# Patient Record
Sex: Female | Born: 1955 | Race: White | Hispanic: No | Marital: Married | Smoking: Former smoker
Health system: Southern US, Community
[De-identification: ages and names within clinical notes are randomized; demographics above are authoritative.]

## PROBLEM LIST (undated history)

## (undated) DIAGNOSIS — I10 Essential (primary) hypertension: Secondary | ICD-10-CM

## (undated) DIAGNOSIS — M069 Rheumatoid arthritis, unspecified: Secondary | ICD-10-CM

## (undated) DIAGNOSIS — K5190 Ulcerative colitis, unspecified, without complications: Secondary | ICD-10-CM

## (undated) HISTORY — PX: APPENDECTOMY: SHX54

## (undated) HISTORY — PX: ABDOMINAL HYSTERECTOMY: SHX81

---

## 2004-07-16 ENCOUNTER — Ambulatory Visit: Payer: Self-pay | Admitting: Family Medicine

## 2005-02-06 ENCOUNTER — Ambulatory Visit: Payer: Self-pay | Admitting: Family Medicine

## 2006-05-29 ENCOUNTER — Ambulatory Visit: Payer: Self-pay | Admitting: Family Medicine

## 2006-10-22 ENCOUNTER — Ambulatory Visit: Payer: Self-pay | Admitting: Family Medicine

## 2006-10-28 ENCOUNTER — Ambulatory Visit: Payer: Self-pay | Admitting: Family Medicine

## 2006-11-03 ENCOUNTER — Ambulatory Visit: Payer: Self-pay | Admitting: Cardiology

## 2006-11-17 ENCOUNTER — Ambulatory Visit: Payer: Self-pay | Admitting: Family Medicine

## 2006-11-19 ENCOUNTER — Ambulatory Visit: Payer: Self-pay | Admitting: *Deleted

## 2006-11-19 ENCOUNTER — Ambulatory Visit: Payer: Self-pay

## 2006-11-19 ENCOUNTER — Encounter: Payer: Self-pay | Admitting: Internal Medicine

## 2006-12-05 ENCOUNTER — Ambulatory Visit: Payer: Self-pay

## 2006-12-05 ENCOUNTER — Ambulatory Visit: Payer: Self-pay | Admitting: Cardiology

## 2007-01-05 ENCOUNTER — Ambulatory Visit: Payer: Self-pay | Admitting: Family Medicine

## 2008-03-06 IMAGING — CT CT CHEST W/O CM
1 of 8 series · 14 of 35 positions shown, 18 images · IV contrast (CONTRAST)
Comparison: NONE

CLINICAL DATA: Right leg DVT, evaluate for pulmonary embolism. 

CT ANGIOGRAPHY CHEST WITHOUT AND WITH INTRAVENOUS CONTRAST
TECHNIQUE: Spiral 3 millimeter thick axial images at 3 mm 
increments were obtained from the lung apex through the upper 
abdomen after the administration of 100 cc of IV contrast.  
Coronal reconstructions were obtained.

[Series 4: arterial 2x2 · axial · arterial · 0.78mm/px · z∈[+1361,+1699]mm · 14 of 197 slices shown, 18 images]
[im 14/197  mediastinal]
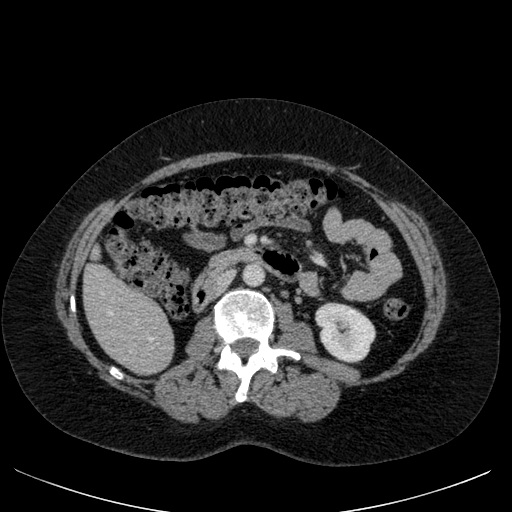
[im 14/197  lung]
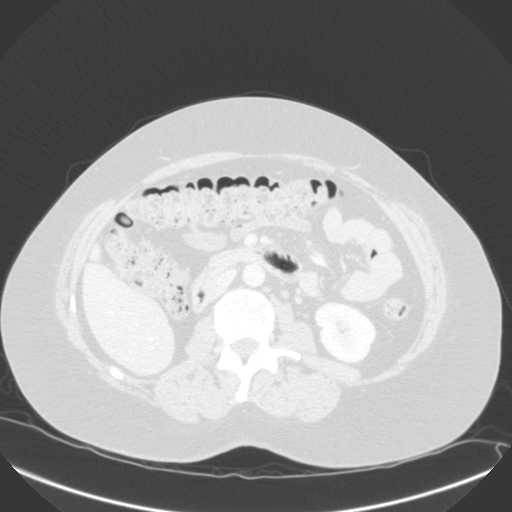
[im 27/197  lung]
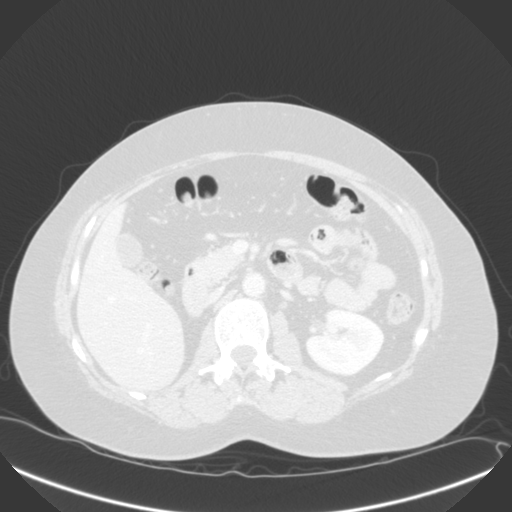
[im 40/197  lung]
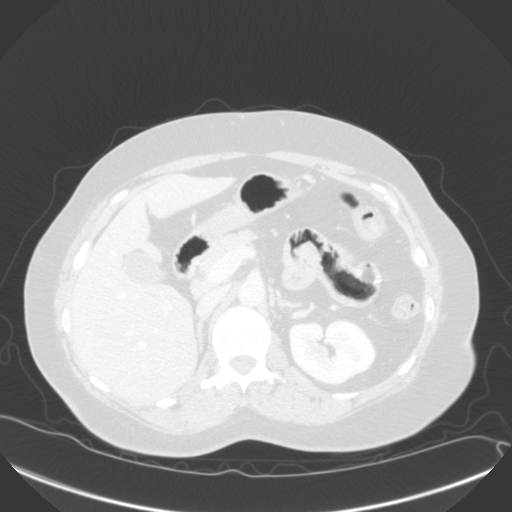
[im 53/197  lung]
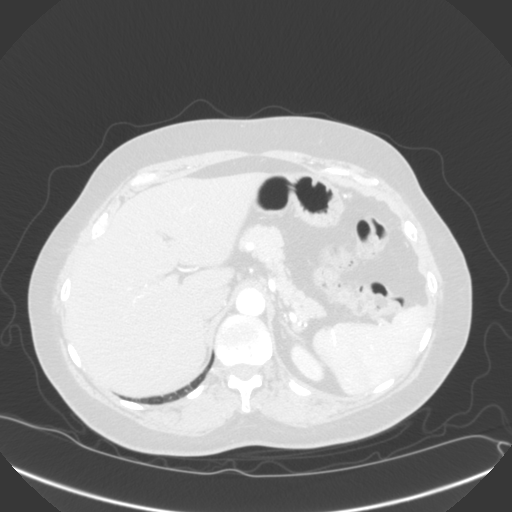
[im 66/197  mediastinal]
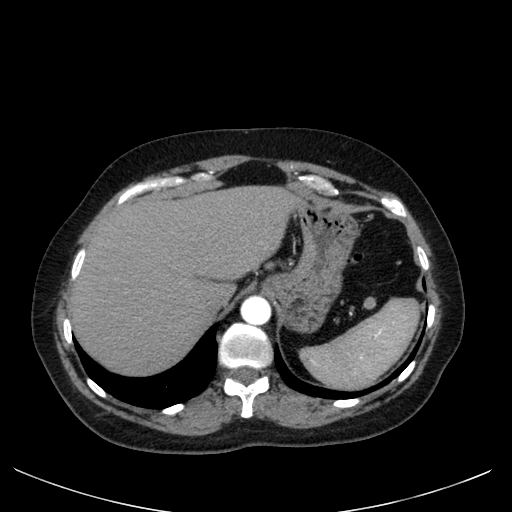
[im 66/197  lung]
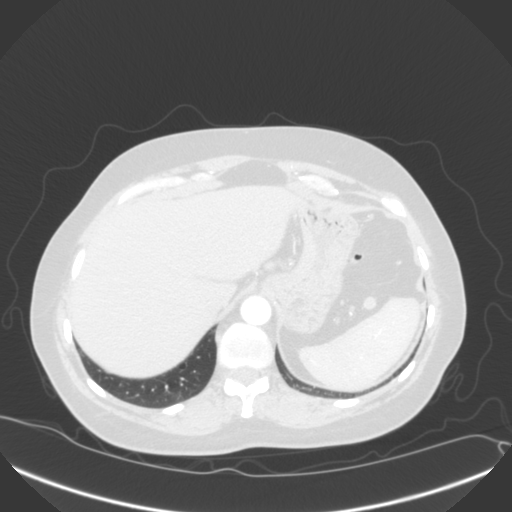
[im 79/197  lung]
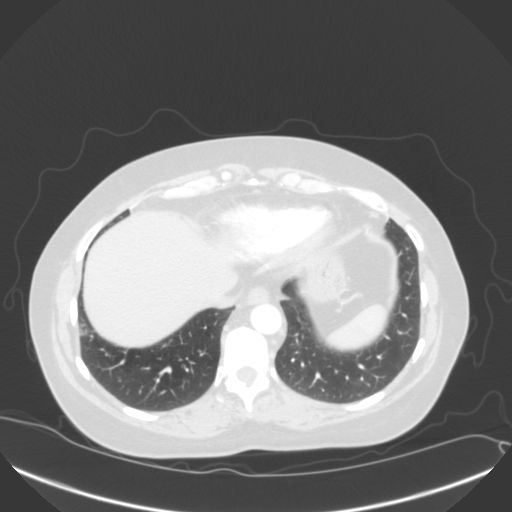
[im 92/197  lung]
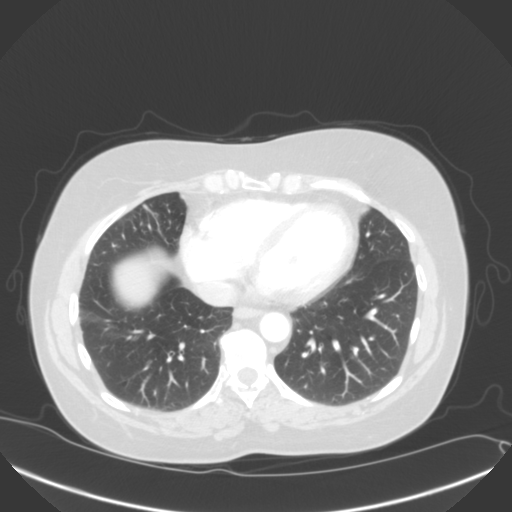
[im 105/197  lung]
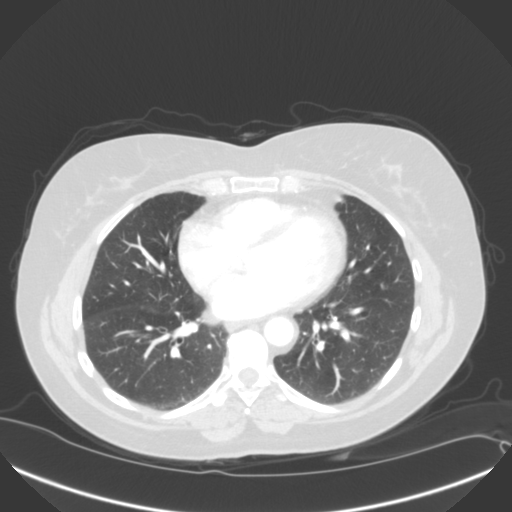
[im 118/197  mediastinal]
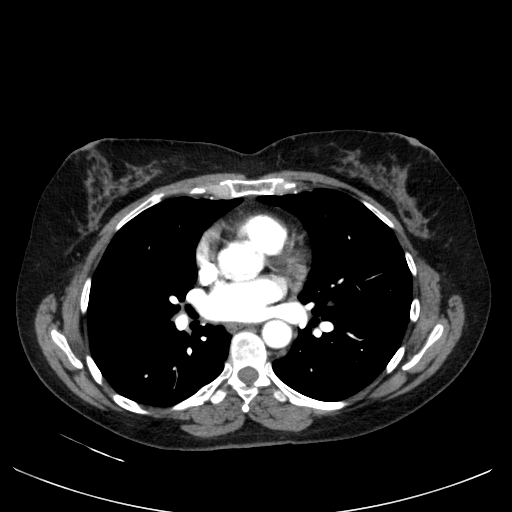
[im 118/197  lung]
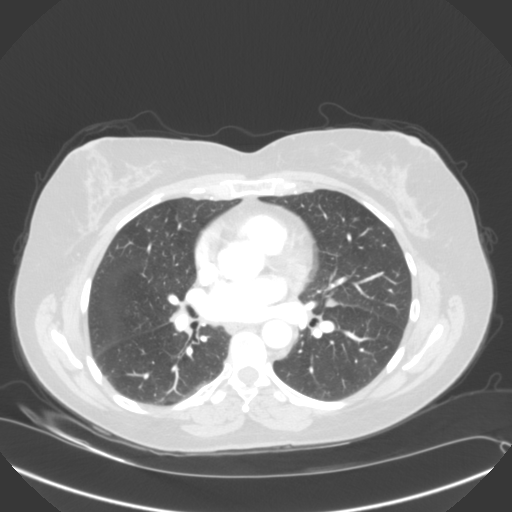
[im 131/197  lung]
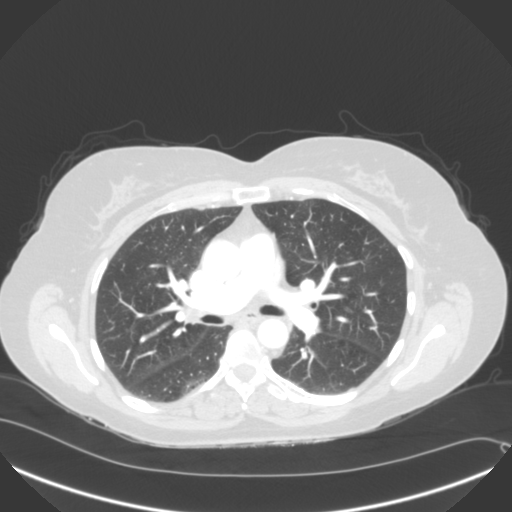
[im 144/197  lung]
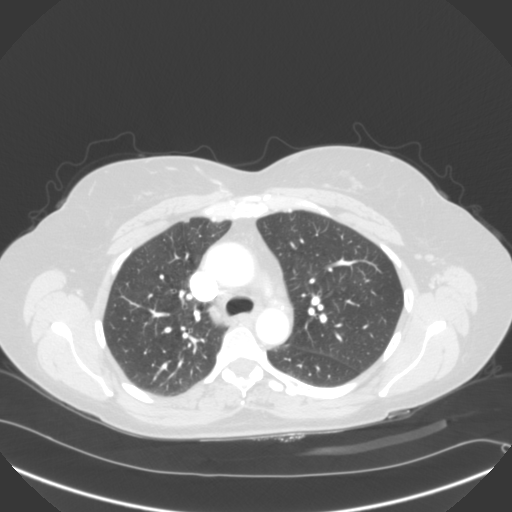
[im 157/197  lung]
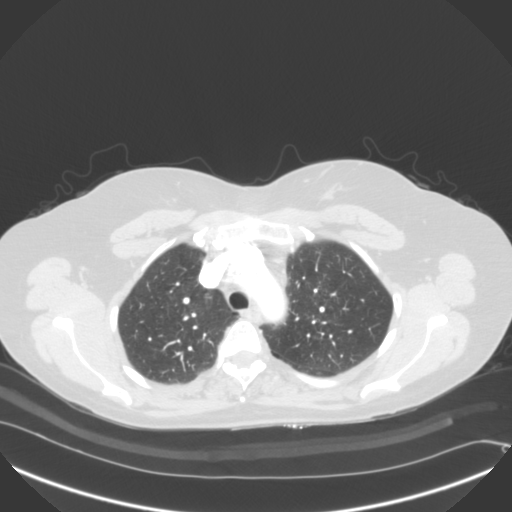
[im 170/197  mediastinal]
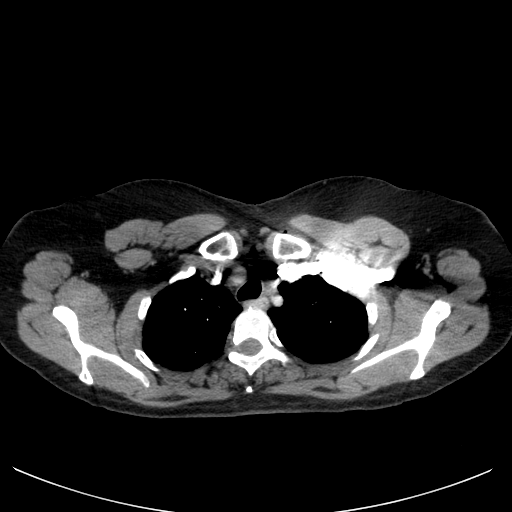
[im 170/197  lung]
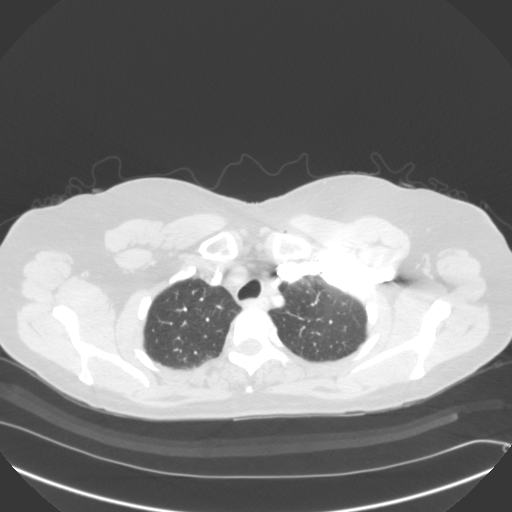
[im 183/197  lung]
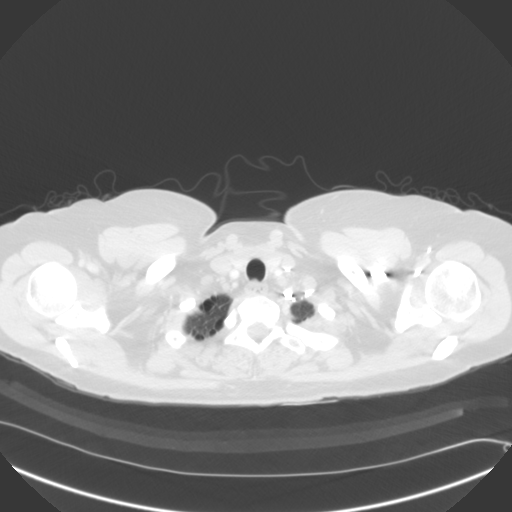

[14 of 35 positions shown; findings below may reference images not displayed]

FINDINGS: There are no prior chest CT studies available for 
comparison. Heart size appears to be within normal limits.  There 
is no evidence of mediastinal lymphadenopathy.  Small preaortic 
and aortopulmonary window nodes are seen.  No evidence of hilar 
lymphadenopathy.  Calcification is seen in the superior segment of 
the left lower lobe.  No pleural effusions or pneumothorax.  Small 
area of accessory splenic tissue is seen.  This measures 
approximately 11 mm in diameter.  There is no evidence of 
pulmonary embolism to the segmental level.
IMPRESSION: No evidence of pulmonary embolism to the segmental 
level. Calcified granuloma suggested in the superior segment of 
11/19/2006 Dict Date: 11/19/2006  Trans Date: 11/19/2006 JH  JLM

## 2010-12-11 NOTE — Procedures (Signed)
[facility]                              EXERCISE TREADMILL   NAME:Courtney Pratt, Courtney Pratt                        MRN:          [id number]  DATE:12/05/2006                            DOB:          11-Jan-1956    PRIMARY CARE PHYSICIAN:  Delaney Meigs, M.D.   PROCEDURE:  Exercise treadmill test.   INDICATION:  Evaluate a patient with chest pain and cardiovascular risk  factors.   PROCEDURE NOTE:  The patient was exercised using standard Bruce  protocol.  She was able to exercise for 8 minutes.  She stopped because  of leg fatigue.  She had no chest discomfort.  She did achieve her  target heart rate of 155, which was 90% of predicted.  She achieved 10.1  mets.  She completed 2 minutes into stage 3.  She had appropriate blood  pressure response with maximum 169/71.  There were no ischemic ST-T wave  changes.  There were no arrhythmias.  She did have a borderline heart  rate recovery.   CONCLUSION:  Negative adequate exercise treadmill test.  There is no  evidence of high grade obstructive coronary disease.  She has moderate  exercise tolerance and a borderline heart rate recovery.   PLAN:  Based on the above, the patient is at intermediate risk for  future cardiovascular events but does not have any evidence for high  grade obstructive coronary disease and needs no further testing.  She  needs continued aggressive risk reduction.  We discussed the need to  stop smoking.  She will continue the medications as listed.  She should  have aggressive lipid management per Dr. Lysbeth Galas.  I would suggest an LDL  less than 100 and HDL in the 50s as a target.   FOLLOWUP:  As needed in this clinic.     Rollene Rotunda, MD, [facility]     JH/MedQ  DD: 12/05/2006  DT: 12/05/2006  Job #: [id number]   cc:   Delaney Meigs, M.D.

## 2010-12-14 NOTE — Assessment & Plan Note (Signed)
[facility]                            CARDIOLOGY OFFICE NOTE   NAME:HODGESDiahn, Courtney Pratt                        MRN:          [id number]  DATE:11/03/2006                            DOB:          08/26/1955    PRIMARY CARE PHYSICIAN:  Dr. Joette Catching.   REASON FOR PRESENTATION:  Evaluate patient with shortness of breath and  chest pain.   HISTORY OF PRESENT ILLNESS:  The patient is a 56 year old with chest  discomfort. This started a couple of weeks ago. She noticed it late 1  evening. She had difficultly lying down without getting chest  discomfort. She slept in a chair that night. She was short of breath.  She presented to urgent care and was subsequently treated with soma and  Vicodin. She said that over several days of taking this regimen her pain  resolved. She still gets some fleeting chest discomfort.   She said her discomfort was under her left breast. It was sharp. It was  worse lying flat or taking a deep breath. There was no fever or chills.  She had been coughing with an upper respiratory infection prior to this.  She had no neck or arm discomfort. She had no associated nausea, or  vomiting, or diaphoresis. She did not have any palpitations above her  baseline. She has reasonable exercise tolerance and this has not  changed. She was having this at rest and could not bring it on with  activity. She has never had any discomfort like this before.   PAST MEDICAL HISTORY:  Palpitations times 12 years (infrequent),  depression.   PAST SURGICAL HISTORY:  Tubal ligation, hysterectomy.   ALLERGIES:  MORPHINE AND AUGMENTIN.   MEDICATIONS:  1. Wellbutrin 150 mg b.i.d.  2. Diltiazem CD 180 mg daily.  3. Premarin.  4. Alprazolam.   SOCIAL HISTORY:  The patient works as a Sport and exercise psychologist. She is  married. She has 1 child. She has been smoking 1 and a half to 1 quater  pack for over 20 years. She occasionally Drinks alcohol.   FAMILY  HISTORY:  Contributory for her mother dying of a CVA at age 25.  There is no early heart disease.   REVIEW OF SYSTEMS:  Positive for wearing glasses, palpitations. Negative  for other systems.   PHYSICAL EXAMINATION:  The patient is in no distress. Blood pressure  149/77, heart rate 86, regular, weight 208, body mass index 31.  HEENT: Eyelids unremarkable, pupils equal, round, and reactive to light.  Fundi not visualized. Oral mucosa, unremarkable.  NECK: No jugular venous distension. Wave form within normal limits.  Carotid upstroke brisk and symmetric. No bruits or thyromegaly.  LYMPHATICS: No cervical, axillary, inguinal adenopathy.  LUNGS: Clear to auscultation bilaterally.  BACK: No costovertebral angle tenderness.  CHEST: Unremarkable.  HEART: PMI not displaced or sustained. S1, and S2, within normal limits.  No S3. No S4, clicks, rubs, murmurs.  ABDOMEN: Flat, positive bowel sounds normal to frequency and pitch, no  bruits. No rebound, no guarding. No midline pulsatile mass,  hepatomegaly, splenomegaly.  SKIN: No rashes.  no nodules  EXTREMITIES: 2 + pulses throughout, no edema, cyanosis, or clubbing.  NEURO: Oriented to person place and time. Cranial nerves II-XII grossly  intact, motor grossly intact.   EKG: Rightward axis, intervals within normal limits, no acute ST-T wave  changes.   ASSESSMENT/PLAN:  1. Chest discomfort, the patient's chest discomfort sounded pleuritic      in nature. It seems to have resolved. I am going to get an      echocardiogram to make sure that there is no effusion or residual      evidence of any pericarditis. Also the patient will have an      exercise treadmill test because of her multiple cardiac risk      factors though I think there is a low pretest probability of      obstructive coronary disease causing her symptoms.  2. Hypertension, blood pressure is slightly elevated. However, this is      the first reading we have with this patient  and this has not been      reported as a problem before. She is advised to keep a watch on      this.  3. Tobacco, we talked about the need to stop smoking. She could take      Chantix with Wellbutrin. She will take this up with Dr. Lysbeth Galas.  4. Follow up will be at the time of her treadmill test and based on      the results of the echocardiogram or any recurrent symptoms.     Rollene Rotunda, MD, [facility]  Electronically Signed    JH/MedQ  DD: 11/03/2006  DT: 11/03/2006  Job #: [id number]   cc:   Delaney Meigs, M.D.

## 2010-12-14 NOTE — Assessment & Plan Note (Signed)
[facility]                            CARDIOLOGY OFFICE NOTE   NAME:Courtney Pratt, Nikolov                        MRN:          [id number]  DATE:11/19/2006                            DOB:          03-27-1956    The patient seen at [facility] on November 19, 2006 for Dr. Glennon Hamilton.  This is the patient of Dr. Antoine Poche and Dr. Joette Catching.  The patient was here for routine treadmill because of recent chest  discomfort; and was complaining of a reddened area and pain in her right  lateral calf that just turned up within the past several days.  She says  that it has been quite painful to touch and to walk; and has, therefore,  not doing her treadmill at home.  When I look at it there was about a 2-  1/2 to 3 inch reddened area on the anterior aspect of her right calf  that is tender to touch.  Because of this, I ordered Dopplers which  showed no evidence of right lower extremity deep venous thrombosis or  incompetence, but there was intraluminal thrombus in a branch of the  greater sickness vein of the mid right calf.  After discussing this with  Dr. Corinda Gubler and Dr. Excell Seltzer; they felt that this was the superficial  thrombophlebitis; and she should be treated with warm compresses.   After looking over Dr. Jenene Slicker note, from April 7, in which she was  having chest discomfort and shortness of breath; we felt the CT scan was  warranted even though a clot in this area would not cause PE.  The  patient still says that she is having some chest discomfort and  shortness of breath, but it was not his ban is one she had seen Dr.  Antoine Poche two weeks ago.   CURRENT MEDICATIONS:  1. Wellbutrin 150 mg b.i.d.  2. Diltiazem 180 mg daily.  3. Premarin.  4. Alprazolam.  5. He also gave her prescription for Celebrex which he takes p.r.n.      for pain.   PHYSICAL EXAMINATION:  This is a pleasant 55 year old white female in no  acute distress.  I do not have her  recorded blood pressure in front of me and she was  back in the treadmill room.  NECK:  Without JVD, HR, bruits, or thyroid enlargement.  LUNGS:  Clear anterior and posterolateral.  HEART:  Regular rate and rhythm at 70 beats per minute.  Normal S1-S2.  No murmur, rub, bruit, thrill, or heave noted.  ABDOMEN:  Soft without organomegaly, masses, or abnormal tenderness.  EXTREMITIES:  Please see the above dictation related to the  thrombophlebitis on her right interior calf area.  She is good distal  pulses.  No edema.   IMPRESSION:  1. Superficial thrombophlebitis of a branch of the right greater      sickness vein in the right mid calf.  2. Recent chest discomfort, pleuritic in nature.  3. Hypertension.  4. Tobacco abuse.   PLAN:  At this time, we will have scheduled her for a CT  of her chest.  We have canceled the treadmill for today and will reschedule for when  the thrombophlebitis is resolved; and she will follow up with Dr.  Antoine Poche after these tests are performed.   ADDENDUM:  She had her 2-D echo today as well, earlier this morning.      Jacolyn Reedy, PA-C  Electronically Signed      Cecil Cranker, MD, [facility]  Electronically Signed   ML/MedQ  DD: 11/19/2006  DT: 11/19/2006  Job #: [id number]

## 2013-05-11 ENCOUNTER — Ambulatory Visit: Payer: 59 | Attending: Orthopedic Surgery | Admitting: Physical Therapy

## 2013-05-11 DIAGNOSIS — IMO0001 Reserved for inherently not codable concepts without codable children: Secondary | ICD-10-CM | POA: Insufficient documentation

## 2013-05-11 DIAGNOSIS — R5381 Other malaise: Secondary | ICD-10-CM | POA: Insufficient documentation

## 2013-05-25 ENCOUNTER — Ambulatory Visit: Payer: 59 | Admitting: *Deleted

## 2015-07-03 ENCOUNTER — Ambulatory Visit: Payer: 59 | Attending: Family Medicine | Admitting: Physical Therapy

## 2015-07-03 DIAGNOSIS — M2560 Stiffness of unspecified joint, not elsewhere classified: Secondary | ICD-10-CM | POA: Insufficient documentation

## 2015-07-03 DIAGNOSIS — M5386 Other specified dorsopathies, lumbar region: Secondary | ICD-10-CM

## 2015-07-03 DIAGNOSIS — M5441 Lumbago with sciatica, right side: Secondary | ICD-10-CM | POA: Insufficient documentation

## 2015-07-03 DIAGNOSIS — G8929 Other chronic pain: Secondary | ICD-10-CM

## 2015-07-03 NOTE — Therapy (Signed)
[facility] [place], [place], [address] Phone: [phone]   Fax:  [phone]  Physical Therapy Evaluation  Patient Details  Name: Courtney Pratt MRN: [id number] Date of Birth: [birth year] Referring Provider: Dione Housekeeper MD  Encounter Date: 07/03/2015      PT End of Session - 07/03/15 1546    Visit Number 1   Number of Visits 12   Date for PT Re-Evaluation 08/14/15   PT Start Time 0315   Activity Tolerance Patient tolerated treatment well   Behavior During Therapy [facility] for tasks assessed/performed      No past medical history on file.  No past surgical history on file.  There were no vitals filed for this visit.  Visit Diagnosis:  Chronic right-sided low back pain with right-sided sciatica - Plan: PT plan of care cert/re-cert  Decreased range of motion of lumbar spine - Plan: PT plan of care cert/re-cert      Subjective Assessment - 07/03/15 1522    Subjective After a long drive my pain-level rises to a 8+/10.   Limitations Sitting   How long can you sit comfortably? 30 minutes.   Patient Stated Goals Get rid of my leg pain.   Currently in Pain? Yes   Pain Score 6    Pain Location Leg   Pain Orientation Right;Left   Pain Descriptors / Indicators Aching   Pain Type Chronic pain   Pain Onset More than a month ago   Pain Frequency Constant   Aggravating Factors  Sitting/driving.   Pain Relieving Factors Rest.            [facility] PT Assessment - 07/03/15 0001    Assessment   Medical Diagnosis Sciatica associated with disorder of lumbar spine.   Referring Provider Dione Housekeeper MD   Onset Date/Surgical Date --  ~ one year.   Precautions   Precautions None   Restrictions   Weight Bearing Restrictions No   Balance Screen   Has the patient fallen in the past 6 months No   Has the patient had a decrease in activity level because of a fear of falling?  No   Is the patient reluctant to leave their home  because of a fear of falling?  No   Home Environment   Living Environment Private residence   Prior Function   Level of Independence Independent   Posture/Postural Control   Posture/Postural Control Postural limitations   Postural Limitations Rounded Shoulders;Forward head;Decreased lumbar lordosis;Flexed trunk   ROM / Strength   AROM / PROM / Strength AROM;Strength   AROM   Overall AROM Comments Active intervertebral flexion limited by 50% and lumbar extension= 14 degrees.   Strength   Overall Strength Comments Normal bilateral LE strength.   Palpation   Palpation comment No palpable low back pain today.   Special Tests    Special Tests --  0+/4+ right Achille's DTR.   Ambulation/Gait   Gait Comments WNL.                   [place] Adult PT Treatment/Exercise - 07/03/15 0001    Modalities   Modalities Traction   Traction   Type of Traction Lumbar   Min (lbs) 5   Max (lbs) 85   Hold Time 99   Rest Time 5   Time 15                     PT Long Term Goals -  07/03/15 1602    PT LONG TERM GOAL #1   Title Ind with an HEP.   Time 6   Period Weeks   Status New   PT LONG TERM GOAL #2   Title Sit 30 minutes with pain not > 3/10.   Time 6   Period Weeks   Status New   PT LONG TERM GOAL #3   Title Perform ADL's with pain not > 3/10.   Time 6   Period Weeks   Status New   PT LONG TERM GOAL #4   Title Eliminate LE pain.               Plan - 07/03/15 1556    Clinical Impression Statement The patient has had ongoing low back pain for more than a year.  the pain has worsened and her CC is that of pain into bilateral hips.  Her right side is worse with pain radiation into her right posterior thigh and occasionally below knee level.  Long drives are very painful when attempting to get out of her vehicle.  She has good lumbar support in her vehicle.  her resting pain-level today is a 5-6/10.   Pt will benefit from skilled therapeutic intervention in  order to improve on the following deficits Pain;Decreased activity tolerance;Decreased range of motion   Rehab Potential Good   PT Frequency 2x / week   PT Duration 6 weeks   PT Treatment/Interventions Electrical Stimulation;Moist Heat;Therapeutic exercise;Therapeutic activities;Ultrasound;Traction;Manual techniques   PT Next Visit Plan Spinal range of motion exercises; core exercises; Int traction at 95# with a max at 120#.         Problem List There are no active problems to display for this patient.   Emalina Dubreuil, [place] MPT 07/03/2015, 4:41 PM  [facility] [address] [place], [place], [address] Phone: [phone]   Fax:  [phone]  Name: Courtney Pratt MRN: [id number] Date of Birth: May 05, 1956

## 2015-07-03 NOTE — Therapy (Signed)
[facility] [place], [place], [address] Phone: [phone]   Fax:  [phone]  Physical Therapy Treatment  Patient Details  Name: Courtney Pratt MRN: [id number] Date of Birth: 03/09/1956 Referring Provider: Dione Housekeeper MD  Encounter Date: 07/03/2015      PT End of Session - 07/03/15 1546    Visit Number 1   Number of Visits 12   Date for PT Re-Evaluation 08/14/15   PT Start Time 0315   Activity Tolerance Patient tolerated treatment well   Behavior During Therapy [facility] for tasks assessed/performed      No past medical history on file.  No past surgical history on file.  There were no vitals filed for this visit.  Visit Diagnosis:  Chronic right-sided low back pain with right-sided sciatica - Plan: PT plan of care cert/re-cert  Decreased range of motion of lumbar spine - Plan: PT plan of care cert/re-cert      Subjective Assessment - 07/03/15 1522    Subjective After a long drive my pain-level rises to a 8+/10.   Limitations Sitting   How long can you sit comfortably? 30 minutes.   Patient Stated Goals Get rid of my leg pain.   Currently in Pain? Yes   Pain Score 6    Pain Location Leg   Pain Orientation Right;Left   Pain Descriptors / Indicators Aching   Pain Type Chronic pain   Pain Onset More than a month ago   Pain Frequency Constant   Aggravating Factors  Sitting/driving.   Pain Relieving Factors Rest.            [facility] PT Assessment - 07/03/15 0001    Assessment   Medical Diagnosis Sciatica associated with disorder of lumbar spine.   Referring Provider Dione Housekeeper MD   Onset Date/Surgical Date --  ~ one year.   Precautions   Precautions None   Restrictions   Weight Bearing Restrictions No   Balance Screen   Has the patient fallen in the past 6 months No   Has the patient had a decrease in activity level because of a fear of falling?  No   Is the patient reluctant to leave their home  because of a fear of falling?  No   Home Environment   Living Environment Private residence   Prior Function   Level of Independence Independent   Posture/Postural Control   Posture/Postural Control Postural limitations   Postural Limitations Rounded Shoulders;Forward head;Decreased lumbar lordosis;Flexed trunk   ROM / Strength   AROM / PROM / Strength AROM;Strength   AROM   Overall AROM Comments Active intervertebral flexion limited by 50% and lumbar extension= 14 degrees.   Strength   Overall Strength Comments Normal bilateral LE strength.   Palpation   Palpation comment No palpable low back pain today.   Special Tests    Special Tests Lumbar  0+/4+ right Achille's DTR.   Lumbar Tests Straight Leg Raise   Straight Leg Raise   Findings Positive   Side  Right   Ambulation/Gait   Gait Comments WNL.                     [place] Adult PT Treatment/Exercise - 07/03/15 0001    Modalities   Modalities Traction   Traction   Type of Traction Lumbar   Min (lbs) 5   Max (lbs) 85   Hold Time 99   Rest Time 5   Time 15  PT Long Term Goals - 07/03/15 1602    PT LONG TERM GOAL #1   Title Ind with an HEP.   Time 6   Period Weeks   Status New   PT LONG TERM GOAL #2   Title Sit 30 minutes with pain not > 3/10.   Time 6   Period Weeks   Status New   PT LONG TERM GOAL #3   Title Perform ADL's with pain not > 3/10.   Time 6   Period Weeks   Status New   PT LONG TERM GOAL #4   Title Eliminate LE pain.               Plan - 07/03/15 1556    Clinical Impression Statement The patient has had ongoing low back pain for more than a year.  the pain has worsened and her CC is that of pain into bilateral hips.  Her right side is worse with pain radiation into her right posterior thigh and occasionally below knee level.  Long drives are very painful when attempting to get out of her vehicle.  She has good lumbar support in her vehicle.   her resting pain-level today is a 5-6/10.   Pt will benefit from skilled therapeutic intervention in order to improve on the following deficits Pain;Decreased activity tolerance;Decreased range of motion   Rehab Potential Good   PT Frequency 2x / week   PT Duration 6 weeks   PT Treatment/Interventions Electrical Stimulation;Moist Heat;Therapeutic exercise;Therapeutic activities;Ultrasound;Traction;Manual techniques   PT Next Visit Plan Spinal range of motion exercises; core exercises; Int traction at 95# with a max at 120#.        Problem List There are no active problems to display for this patient.   Dequita Schleicher, [place] MPT 07/03/2015, 5:04 PM  [facility] [address] [place], [place], [address] Phone: [phone]   Fax:  [phone]  Name: Courtney Pratt MRN: [id number] Date of Birth: [birth year]

## 2015-07-06 ENCOUNTER — Ambulatory Visit: Payer: 59 | Admitting: *Deleted

## 2015-07-06 ENCOUNTER — Encounter: Payer: Self-pay | Admitting: *Deleted

## 2015-07-06 DIAGNOSIS — G8929 Other chronic pain: Secondary | ICD-10-CM

## 2015-07-06 DIAGNOSIS — M5386 Other specified dorsopathies, lumbar region: Secondary | ICD-10-CM

## 2015-07-06 DIAGNOSIS — M5441 Lumbago with sciatica, right side: Secondary | ICD-10-CM | POA: Diagnosis not present

## 2015-07-06 NOTE — Therapy (Signed)
[facility] [place], [place], [address] Phone: [phone]   Fax:  [phone]  Physical Therapy Treatment  Patient Details  Name: Courtney Pratt MRN: [id number] Date of Birth: Dec 22, 1955 Referring Provider: Dione Housekeeper MD  Encounter Date: 07/06/2015      PT End of Session - 07/06/15 1738    Visit Number 2   Number of Visits 12   Date for PT Re-Evaluation 08/14/15   PT Start Time [id number]   PT Stop Time [id number]   PT Time Calculation (min) 50 min      History reviewed. No pertinent past medical history.  History reviewed. No pertinent past surgical history.  There were no vitals filed for this visit.  Visit Diagnosis:  Chronic right-sided low back pain with right-sided sciatica  Decreased range of motion of lumbar spine      Subjective Assessment - 07/06/15 1640    Subjective After a long drive my pain-level rises to a 8+/10., Did ok with Traction   Limitations Sitting   How long can you sit comfortably? 30 minutes.   Patient Stated Goals Get rid of my leg pain.   Currently in Pain? Yes   Pain Score 4    Pain Location Back   Pain Orientation Right;Left   Pain Descriptors / Indicators Aching   Pain Type Chronic pain   Pain Onset More than a month ago   Pain Frequency Constant                         [facility] Adult PT Treatment/Exercise - 07/06/15 0001    Exercises   Exercises Lumbar   Lumbar Exercises: Supine   Ab Set 20 reps;5 seconds   Bent Knee Raise 20 reps;3 seconds  Marching   Other Supine Lumbar Exercises posterior pelvic tilt 2 x 10   Modalities   Modalities Electrical Stimulation;Ultrasound;Traction   Ultrasound   Ultrasound Location RT side LB paras   Ultrasound Parameters 1.5 w/cm2 x 10 mins   Ultrasound Goals Pain   Traction   Type of Traction Lumbar   Min (lbs) 5   Max (lbs) 95   Hold Time 99   Rest Time 5   Time 15                     PT Long Term  Goals - 07/03/15 1602    PT LONG TERM GOAL #1   Title Ind with an HEP.   Time 6   Period Weeks   Status New   PT LONG TERM GOAL #2   Title Sit 30 minutes with pain not > 3/10.   Time 6   Period Weeks   Status New   PT LONG TERM GOAL #3   Title Perform ADL's with pain not > 3/10.   Time 6   Period Weeks   Status New   PT LONG TERM GOAL #4   Title Eliminate LE pain.               Plan - 07/06/15 1739    Clinical Impression Statement Pt did well with Rx today. She did ok with Draw-in for core activation, but needs verbal and tactile cues to perform correct technique. She did well with marching, but was unable to perform pelvic tilt very well and needs more practice. Pt tolerated Pelvic Traction at 95#s today  with no RT LE pain at end of Rx. Goals are ongoing  Pt will benefit from skilled therapeutic intervention in order to improve on the following deficits Pain;Decreased activity tolerance;Decreased range of motion   Rehab Potential Good   PT Frequency 2x / week   PT Duration 6 weeks   PT Treatment/Interventions Electrical Stimulation;Moist Heat;Therapeutic exercise;Therapeutic activities;Ultrasound;Traction;Manual techniques   PT Next Visit Plan Spinal range of motion exercises; core exercises; Int traction at 95# with a max at 120#.   Consulted and Agree with Plan of Care Patient        Problem List There are no active problems to display for this patient.   Mariane Burpee,CHRIS, PTA 07/06/2015, 5:52 PM  [facility] [address] [place], [place], [address] Phone: [phone]   Fax:  [phone]  Name: Courtney Pratt MRN: [id number] Date of Birth: [birth year]

## 2015-07-11 ENCOUNTER — Ambulatory Visit: Payer: 59 | Admitting: Physical Therapy

## 2015-07-11 DIAGNOSIS — M5441 Lumbago with sciatica, right side: Secondary | ICD-10-CM | POA: Diagnosis not present

## 2015-07-11 DIAGNOSIS — M5386 Other specified dorsopathies, lumbar region: Secondary | ICD-10-CM

## 2015-07-11 DIAGNOSIS — G8929 Other chronic pain: Secondary | ICD-10-CM

## 2015-07-11 NOTE — Therapy (Signed)
[facility] [place], [place], [address] Phone: [phone]   Fax:  [phone]  Physical Therapy Treatment  Patient Details  Name: Courtney Pratt MRN: [id number] Date of Birth: Dec 20, 1955 Referring Provider: Dione Housekeeper MD  Encounter Date: 07/11/2015      PT End of Session - 07/11/15 1805    Visit Number 3   Number of Visits 12   Date for PT Re-Evaluation 08/14/15   PT Start Time 0445   PT Stop Time 0553   PT Time Calculation (min) 68 min   Activity Tolerance Patient tolerated treatment well   Behavior During Therapy [facility] for tasks assessed/performed      No past medical history on file.  No past surgical history on file.  There were no vitals filed for this visit.  Visit Diagnosis:  Chronic right-sided low back pain with right-sided sciatica  Decreased range of motion of lumbar spine      Subjective Assessment - 07/11/15 1759    Subjective Doing ok with traction.  Pain increases after driving though.   Limitations Sitting   How long can you sit comfortably? 30 minutes.   Patient Stated Goals Get rid of my leg pain.   Pain Score 6    Pain Location Back   Pain Orientation Right   Pain Descriptors / Indicators Aching   Pain Type Chronic pain   Pain Onset More than a month ago                         [facility] Adult PT Treatment/Exercise - 07/11/15 0001    Modalities   Modalities Electrical Stimulation   Electrical Stimulation   Electrical Stimulation Location IFC to low back x 20 minutes.   Electrical Stimulation Action 80-150 HZ   Traction   Type of Traction Lumbar   Min (lbs) 5   Max (lbs) 100   Hold Time 99   Rest Time 5   Time 15   Manual Therapy   Manual therapy comments Left SDLY position with 2 pillows between knees: STW/M x 10 minutes.                     PT Long Term Goals - 07/03/15 1602    PT LONG TERM GOAL #1   Title Ind with an HEP.   Time 6   Period Weeks   Status New   PT LONG TERM GOAL #2   Title Sit 30 minutes with pain not > 3/10.   Time 6   Period Weeks   Status New   PT LONG TERM GOAL #3   Title Perform ADL's with pain not > 3/10.   Time 6   Period Weeks   Status New   PT LONG TERM GOAL #4   Title Eliminate LE pain.               Problem List There are no active problems to display for this patient.   Laren Whaling, [place] MPT 07/11/2015, 6:07 PM  [facility] [address] [place], [place], [address] Phone: [phone]   Fax:  [phone]  Name: Courtney Pratt MRN: [id number] Date of Birth: 23-Feb-1956

## 2015-07-13 ENCOUNTER — Ambulatory Visit: Payer: 59 | Admitting: Physical Therapy

## 2015-07-13 DIAGNOSIS — M5386 Other specified dorsopathies, lumbar region: Secondary | ICD-10-CM

## 2015-07-13 DIAGNOSIS — M5441 Lumbago with sciatica, right side: Secondary | ICD-10-CM | POA: Diagnosis not present

## 2015-07-13 DIAGNOSIS — G8929 Other chronic pain: Secondary | ICD-10-CM

## 2015-07-13 NOTE — Therapy (Signed)
[facility] [place], [place], [address] Phone: [phone]   Fax:  [phone]  Physical Therapy Treatment  Patient Details  Name: Courtney Pratt MRN: [id number] Date of Birth: [birth year] Referring Provider: Dione Housekeeper MD  Encounter Date: 07/13/2015      PT End of Session - 07/13/15 1656    Visit Number 4   Number of Visits 12   Date for PT Re-Evaluation 08/14/15   PT Start Time [id number]      No past medical history on file.  No past surgical history on file.  There were no vitals filed for this visit.  Visit Diagnosis:  Chronic right-sided low back pain with right-sided sciatica  Decreased range of motion of lumbar spine      Subjective Assessment - 07/13/15 1738    Subjective Doing good today with a pain-level of 3/10.   Limitations Sitting   How long can you sit comfortably? 30 minutes.   Patient Stated Goals Get rid of my leg pain.   Pain Score 3    Pain Location Back   Pain Orientation Right   Pain Descriptors / Indicators Aching   Pain Type Chronic pain                         [facility] Adult PT Treatment/Exercise - 07/13/15 0001    Modalities   Modalities Electrical Stimulation   Electrical Stimulation   Electrical Stimulation Location IFC to low back x 20 minutes.   Traction   Type of Traction Lumbar   Min (lbs) 5   Max (lbs) 100   Hold Time 99   Rest Time 5   Time 15   Manual Therapy   Manual therapy comments Prone over pillows for comfort while patient received gentle lower lumbar PA mobs and STW/M x 9 minutes.                     PT Long Term Goals - 07/13/15 1743    PT LONG TERM GOAL #1   Title Ind with an HEP.   Time 6   Period Weeks   Status On-going   PT LONG TERM GOAL #2   Title Sit 30 minutes with pain not > 3/10.   Time 6   Period Weeks   Status On-going   PT LONG TERM GOAL #3   Title Perform ADL's with pain not > 3/10.   Time 6   Period  Weeks   Status On-going   PT LONG TERM GOAL #4   Title Eliminate LE pain.               Plan - 07/13/15 1741    Clinical Impression Statement Patient doing better with treatments with a pain-level rated at 3/10 today.   Pt will benefit from skilled therapeutic intervention in order to improve on the following deficits Pain;Decreased activity tolerance;Decreased range of motion   Rehab Potential Good   PT Frequency 2x / week   PT Duration 6 weeks   PT Treatment/Interventions Electrical Stimulation;Moist Heat;Therapeutic exercise;Therapeutic activities;Ultrasound;Traction;Manual techniques   PT Next Visit Plan Core exercises and int traction at 105#.        Problem List There are no active problems to display for this patient.   Welford Christmas, [place] MPT 07/13/2015, 5:46 PM  [facility] [address] [place], [place], [address] Phone: [phone]   Fax:  [phone]  Name: Courtney Misa  Pratt MRN: [id number] Date of Birth: February 19, 1956

## 2015-07-18 ENCOUNTER — Ambulatory Visit: Payer: 59 | Admitting: Physical Therapy

## 2015-07-18 DIAGNOSIS — G8929 Other chronic pain: Secondary | ICD-10-CM

## 2015-07-18 DIAGNOSIS — M5441 Lumbago with sciatica, right side: Secondary | ICD-10-CM | POA: Diagnosis not present

## 2015-07-18 DIAGNOSIS — M5386 Other specified dorsopathies, lumbar region: Secondary | ICD-10-CM

## 2015-07-18 NOTE — Therapy (Signed)
[facility] [place], [place], [address] Phone: [phone]   Fax:  [phone]  Physical Therapy Treatment  Patient Details  Name: Courtney Pratt MRN: [id number] Date of Birth: 1955/10/20 Referring Provider: Dione Housekeeper MD  Encounter Date: 07/18/2015      PT End of Session - 07/18/15 1632    Visit Number 5   Number of Visits 12   Date for PT Re-Evaluation 08/14/15   PT Start Time 1632   PT Stop Time 1716   PT Time Calculation (min) 44 min   Activity Tolerance Patient tolerated treatment well   Behavior During Therapy [facility] for tasks assessed/performed      No past medical history on file.  No past surgical history on file.  There were no vitals filed for this visit.  Visit Diagnosis:  Chronic right-sided low back pain with right-sided sciatica  Decreased range of motion of lumbar spine      Subjective Assessment - 07/18/15 1630    Subjective Went to [employer] and could hardly get out of car. Patient states that hips are worse than back. Reports no real benefits from traction.   Limitations Sitting   How long can you sit comfortably? 30 minutes.   Patient Stated Goals Get rid of my leg pain.   Currently in Pain? Yes   Pain Score 4    Pain Location Back   Pain Orientation Lower   Pain Descriptors / Indicators Sore   Pain Type Chronic pain   Pain Onset More than a month ago   Pain Frequency Constant   Aggravating Factors  Sitting, driving   Pain Relieving Factors Rest             [facility] PT Assessment - 07/18/15 0001    Assessment   Medical Diagnosis Sciatica associated with disorder of lumbar spine.   Next MD Visit 08/2015   Precautions   Precautions None                     [facility] Adult PT Treatment/Exercise - 07/18/15 0001    Lumbar Exercises: Supine   Ab Set 20 reps;5 seconds   Clam 20 reps   Heel Slides 10 reps;Other (comment)  Pain with R>L   Bent Knee Raise 20 reps;3  seconds   Bridge 20 reps   Straight Leg Raise 20 reps;Other (comment)  Bilaterally; RLE burn per patient report   Other Supine Lumbar Exercises posterior pelvic tilt 2 x 10   Modalities   Modalities Electrical Stimulation;Moist Heat   Moist Heat Therapy   Number Minutes Moist Heat 15 Minutes   Moist Heat Location Lumbar Spine;Hip   Electrical Stimulation   Electrical Stimulation Location B low back/ hip   Electrical Stimulation Action Pre-Mod   Electrical Stimulation Parameters 80-150 Hz x15 min   Electrical Stimulation Goals Pain                     PT Long Term Goals - 07/13/15 1743    PT LONG TERM GOAL #1   Title Ind with an HEP.   Time 6   Period Weeks   Status On-going   PT LONG TERM GOAL #2   Title Sit 30 minutes with pain not > 3/10.   Time 6   Period Weeks   Status On-going   PT LONG TERM GOAL #3   Title Perform ADL's with pain not > 3/10.   Time 6   Period  Weeks   Status On-going   PT LONG TERM GOAL #4   Title Eliminate LE pain.               Plan - 07/18/15 1709    Clinical Impression Statement Patient tolerated today's treatment well with only complaints of hip pain with certain exercises. Completed all exercises with core activation to strengthen the lumbo-pelvic-hip complex to strengthen both the patient's hips and her low back. Patient required minimal multimodal cueing for proper technique of exercises. Traction was not completed today secondary to patient not seeing any benefit from the sessions. Goals remain on-going at this time secondary to pain. Normal stimulation and moist heat response noted following removal of the modalites.Experienced 2-3/10 low back pain following today's treatment.   Pt will benefit from skilled therapeutic intervention in order to improve on the following deficits Pain;Decreased activity tolerance;Decreased range of motion   Rehab Potential Good   PT Frequency 2x / week   PT Duration 6 weeks   PT  Treatment/Interventions Electrical Stimulation;Moist Heat;Therapeutic exercise;Therapeutic activities;Ultrasound;Traction;Manual techniques   PT Next Visit Plan Continue core strengthening and modalities PRN.   Consulted and Agree with Plan of Care Patient        Problem List There are no active problems to display for this patient.   Wynelle Fanny, PTA 07/18/2015, 5:29 PM  [facility] [address] [place], [place], [address] Phone: [phone]   Fax:  [phone]  Name: Courtney Pratt MRN: [id number] Date of Birth: [birth year]

## 2015-07-20 ENCOUNTER — Ambulatory Visit: Payer: 59 | Admitting: Physical Therapy

## 2015-07-20 DIAGNOSIS — M5386 Other specified dorsopathies, lumbar region: Secondary | ICD-10-CM

## 2015-07-20 DIAGNOSIS — M5441 Lumbago with sciatica, right side: Secondary | ICD-10-CM | POA: Diagnosis not present

## 2015-07-20 DIAGNOSIS — G8929 Other chronic pain: Secondary | ICD-10-CM

## 2015-07-20 NOTE — Therapy (Addendum)
[facility] [place], [place], [address] Phone: [phone]   Fax:  [phone]  Physical Therapy Treatment  Patient Details  Name: Courtney Pratt MRN: [id number] Date of Birth: December 28, 1955 Referring Provider: Dione Housekeeper MD  Encounter Date: 07/20/2015    No past medical history on file.  No past surgical history on file.  There were no vitals filed for this visit.  Visit Diagnosis:  Chronic right-sided low back pain with right-sided sciatica  Decreased range of motion of lumbar spine                                    PT Long Term Goals - 07/13/15 1743      PT LONG TERM GOAL #1   Title Ind with an HEP.   Time 6   Period Weeks   Status On-going     PT LONG TERM GOAL #2   Title Sit 30 minutes with pain not > 3/10.   Time 6   Period Weeks   Status On-going     PT LONG TERM GOAL #3   Title Perform ADL's with pain not > 3/10.   Time 6   Period Weeks   Status On-going     PT LONG TERM GOAL #4   Title Eliminate LE pain.               Problem List There are no active problems to display for this patient.   APPLEGATE, [place], PTA 05/17/2016, 1:10 PM  [facility] [address] [place], [place], [address] Phone: [phone]   Fax:  [phone]  Name: Courtney Pratt MRN: [id number] Date of Birth: [birth year]   PHYSICAL THERAPY DISCHARGE SUMMARY  Visits from Start of Care: 6.  Current functional level related to goals / functional outcomes: Please se above.   Remaining deficits: Continued pain and LE symptoms.   Education / Equipment: HEP. Plan: Patient agrees to discharge.  Patient goals were not met. Patient is being discharged due to not returning since the last visit.  ?????         [place] Applegate MPT

## 2017-08-29 DIAGNOSIS — M069 Rheumatoid arthritis, unspecified: Secondary | ICD-10-CM | POA: Diagnosis not present

## 2017-08-29 DIAGNOSIS — I10 Essential (primary) hypertension: Secondary | ICD-10-CM | POA: Diagnosis not present

## 2017-09-01 DIAGNOSIS — Z1211 Encounter for screening for malignant neoplasm of colon: Secondary | ICD-10-CM | POA: Diagnosis not present

## 2017-10-15 DIAGNOSIS — K5180 Other ulcerative colitis without complications: Secondary | ICD-10-CM | POA: Diagnosis not present

## 2017-10-15 DIAGNOSIS — M4690 Unspecified inflammatory spondylopathy, site unspecified: Secondary | ICD-10-CM | POA: Diagnosis not present

## 2017-10-15 DIAGNOSIS — M150 Primary generalized (osteo)arthritis: Secondary | ICD-10-CM | POA: Diagnosis not present

## 2018-01-15 DIAGNOSIS — M4690 Unspecified inflammatory spondylopathy, site unspecified: Secondary | ICD-10-CM | POA: Diagnosis not present

## 2018-01-15 DIAGNOSIS — M150 Primary generalized (osteo)arthritis: Secondary | ICD-10-CM | POA: Diagnosis not present

## 2018-01-15 DIAGNOSIS — K5180 Other ulcerative colitis without complications: Secondary | ICD-10-CM | POA: Diagnosis not present

## 2018-02-24 DIAGNOSIS — E785 Hyperlipidemia, unspecified: Secondary | ICD-10-CM | POA: Diagnosis not present

## 2018-02-24 DIAGNOSIS — R799 Abnormal finding of blood chemistry, unspecified: Secondary | ICD-10-CM | POA: Diagnosis not present

## 2018-02-26 DIAGNOSIS — M069 Rheumatoid arthritis, unspecified: Secondary | ICD-10-CM | POA: Diagnosis not present

## 2018-02-26 DIAGNOSIS — I10 Essential (primary) hypertension: Secondary | ICD-10-CM | POA: Diagnosis not present

## 2018-03-03 DIAGNOSIS — Z1211 Encounter for screening for malignant neoplasm of colon: Secondary | ICD-10-CM | POA: Diagnosis not present

## 2018-03-03 DIAGNOSIS — Z1212 Encounter for screening for malignant neoplasm of rectum: Secondary | ICD-10-CM | POA: Diagnosis not present

## 2018-03-19 DIAGNOSIS — K52832 Lymphocytic colitis: Secondary | ICD-10-CM | POA: Diagnosis not present

## 2018-03-19 DIAGNOSIS — R195 Other fecal abnormalities: Secondary | ICD-10-CM | POA: Diagnosis not present

## 2018-04-14 DIAGNOSIS — K635 Polyp of colon: Secondary | ICD-10-CM | POA: Diagnosis not present

## 2018-04-14 DIAGNOSIS — D122 Benign neoplasm of ascending colon: Secondary | ICD-10-CM | POA: Diagnosis not present

## 2018-04-14 DIAGNOSIS — D124 Benign neoplasm of descending colon: Secondary | ICD-10-CM | POA: Diagnosis not present

## 2018-04-14 DIAGNOSIS — R195 Other fecal abnormalities: Secondary | ICD-10-CM | POA: Diagnosis not present

## 2018-04-15 DIAGNOSIS — R0602 Shortness of breath: Secondary | ICD-10-CM | POA: Diagnosis not present

## 2018-04-15 DIAGNOSIS — K5180 Other ulcerative colitis without complications: Secondary | ICD-10-CM | POA: Diagnosis not present

## 2018-04-15 DIAGNOSIS — M4690 Unspecified inflammatory spondylopathy, site unspecified: Secondary | ICD-10-CM | POA: Diagnosis not present

## 2018-04-15 DIAGNOSIS — M150 Primary generalized (osteo)arthritis: Secondary | ICD-10-CM | POA: Diagnosis not present

## 2018-04-30 DIAGNOSIS — Z23 Encounter for immunization: Secondary | ICD-10-CM | POA: Diagnosis not present

## 2018-05-14 DIAGNOSIS — L218 Other seborrheic dermatitis: Secondary | ICD-10-CM | POA: Diagnosis not present

## 2018-05-14 DIAGNOSIS — L728 Other follicular cysts of the skin and subcutaneous tissue: Secondary | ICD-10-CM | POA: Diagnosis not present

## 2018-06-04 DIAGNOSIS — K119 Disease of salivary gland, unspecified: Secondary | ICD-10-CM | POA: Diagnosis not present

## 2018-07-20 DIAGNOSIS — M2550 Pain in unspecified joint: Secondary | ICD-10-CM | POA: Diagnosis not present

## 2018-07-20 DIAGNOSIS — M4690 Unspecified inflammatory spondylopathy, site unspecified: Secondary | ICD-10-CM | POA: Diagnosis not present

## 2018-07-20 DIAGNOSIS — K5180 Other ulcerative colitis without complications: Secondary | ICD-10-CM | POA: Diagnosis not present

## 2018-07-20 DIAGNOSIS — M150 Primary generalized (osteo)arthritis: Secondary | ICD-10-CM | POA: Diagnosis not present

## 2018-07-20 DIAGNOSIS — R05 Cough: Secondary | ICD-10-CM | POA: Diagnosis not present

## 2018-08-28 DIAGNOSIS — I10 Essential (primary) hypertension: Secondary | ICD-10-CM | POA: Diagnosis not present

## 2018-08-31 DIAGNOSIS — M069 Rheumatoid arthritis, unspecified: Secondary | ICD-10-CM | POA: Diagnosis not present

## 2018-08-31 DIAGNOSIS — I10 Essential (primary) hypertension: Secondary | ICD-10-CM | POA: Diagnosis not present

## 2018-09-15 DIAGNOSIS — Z1231 Encounter for screening mammogram for malignant neoplasm of breast: Secondary | ICD-10-CM | POA: Diagnosis not present

## 2018-10-19 DIAGNOSIS — M4690 Unspecified inflammatory spondylopathy, site unspecified: Secondary | ICD-10-CM | POA: Diagnosis not present

## 2018-10-19 DIAGNOSIS — K5180 Other ulcerative colitis without complications: Secondary | ICD-10-CM | POA: Diagnosis not present

## 2018-10-19 DIAGNOSIS — M150 Primary generalized (osteo)arthritis: Secondary | ICD-10-CM | POA: Diagnosis not present

## 2018-10-19 DIAGNOSIS — Z79899 Other long term (current) drug therapy: Secondary | ICD-10-CM | POA: Diagnosis not present

## 2019-10-15 ENCOUNTER — Ambulatory Visit: Payer: 59 | Attending: Internal Medicine

## 2019-10-15 ENCOUNTER — Ambulatory Visit: Payer: Self-pay

## 2019-10-15 DIAGNOSIS — Z23 Encounter for immunization: Secondary | ICD-10-CM

## 2019-10-15 NOTE — Progress Notes (Signed)
   Covid-19 Vaccination Clinic  Name:  Courtney Pratt    MRN: [id number] DOB: 1956-05-25  10/15/2019  [name] was observed post Covid-19 immunization for 30 minutes based on pre-vaccination screening without incident. She was provided with Vaccine Information Sheet and instruction to access the V-Safe system.   [name] was instructed to call 911 with any severe reactions post vaccine: Marland Kitchen Difficulty breathing  . Swelling of face and throat  . A fast heartbeat  . A bad rash all over body  . Dizziness and weakness   Immunizations Administered    Name Date Dose VIS Date Route   Moderna COVID-19 Vaccine 10/15/2019 11:07 AM 0.5 mL 06/29/2019 Intramuscular   Manufacturer: Moderna   Lot: [id number]   [id number]

## 2019-10-25 ENCOUNTER — Other Ambulatory Visit (HOSPITAL_COMMUNITY): Payer: Self-pay | Admitting: Nurse Practitioner

## 2019-10-25 DIAGNOSIS — Z1231 Encounter for screening mammogram for malignant neoplasm of breast: Secondary | ICD-10-CM

## 2019-11-16 ENCOUNTER — Ambulatory Visit: Payer: 59 | Attending: Internal Medicine

## 2019-11-16 ENCOUNTER — Ambulatory Visit: Payer: Self-pay

## 2019-11-16 DIAGNOSIS — Z23 Encounter for immunization: Secondary | ICD-10-CM

## 2019-11-16 NOTE — Progress Notes (Signed)
   Covid-19 Vaccination Clinic  Name:  Courtney Pratt    MRN: [id number] DOB: 11/12/1955  11/16/2019  Courtney Pratt was observed post Covid-19 immunization for 15 minutes without incident. She was provided with Vaccine Information Sheet and instruction to access the V-Safe system.   Courtney Pratt was instructed to call 911 with any severe reactions post vaccine: Marland Kitchen Difficulty breathing  . Swelling of face and throat  . A fast heartbeat  . A bad rash all over body  . Dizziness and weakness   Immunizations Administered    Name Date Dose VIS Date Route   Moderna COVID-19 Vaccine 11/16/2019  9:34 AM 0.5 mL 06/2019 Intramuscular   Manufacturer: Moderna   Lot: [id number]   [id number]

## 2020-01-06 ENCOUNTER — Ambulatory Visit (HOSPITAL_COMMUNITY)
Admission: RE | Admit: 2020-01-06 | Discharge: 2020-01-06 | Disposition: A | Discharge status: Home / Self Care | Payer: 59 | Source: Ambulatory Visit | Attending: Nurse Practitioner | Admitting: Nurse Practitioner

## 2020-01-06 ENCOUNTER — Other Ambulatory Visit: Payer: Self-pay

## 2020-01-06 DIAGNOSIS — Z1231 Encounter for screening mammogram for malignant neoplasm of breast: Secondary | ICD-10-CM | POA: Diagnosis present

## 2021-01-09 ENCOUNTER — Other Ambulatory Visit (HOSPITAL_COMMUNITY): Payer: Self-pay | Admitting: Family Medicine

## 2021-01-09 DIAGNOSIS — Z1231 Encounter for screening mammogram for malignant neoplasm of breast: Secondary | ICD-10-CM

## 2021-01-25 ENCOUNTER — Other Ambulatory Visit: Payer: Self-pay

## 2021-01-25 ENCOUNTER — Ambulatory Visit (HOSPITAL_COMMUNITY)
Admission: RE | Admit: 2021-01-25 | Discharge: 2021-01-25 | Disposition: A | Discharge status: Home / Self Care | Payer: 59 | Source: Ambulatory Visit | Attending: Family Medicine | Admitting: Family Medicine

## 2021-01-25 DIAGNOSIS — Z1231 Encounter for screening mammogram for malignant neoplasm of breast: Secondary | ICD-10-CM | POA: Diagnosis present

## 2022-01-12 ENCOUNTER — Emergency Department (HOSPITAL_COMMUNITY): Payer: 59

## 2022-01-12 ENCOUNTER — Other Ambulatory Visit: Payer: Self-pay

## 2022-01-12 ENCOUNTER — Emergency Department (HOSPITAL_COMMUNITY)
Admission: EM | Admit: 2022-01-12 | Discharge: 2022-01-12 | Disposition: A | Discharge status: Home / Self Care | Payer: 59 | Attending: Emergency Medicine | Admitting: Emergency Medicine

## 2022-01-12 ENCOUNTER — Encounter (HOSPITAL_COMMUNITY): Payer: Self-pay | Admitting: Emergency Medicine

## 2022-01-12 DIAGNOSIS — U071 COVID-19: Secondary | ICD-10-CM | POA: Diagnosis not present

## 2022-01-12 DIAGNOSIS — R509 Fever, unspecified: Secondary | ICD-10-CM | POA: Diagnosis present

## 2022-01-12 HISTORY — DX: Essential (primary) hypertension: I10

## 2022-01-12 HISTORY — DX: Rheumatoid arthritis, unspecified: M06.9

## 2022-01-12 HISTORY — DX: Ulcerative colitis, unspecified, without complications: K51.90

## 2022-01-12 LAB — CBC WITH DIFFERENTIAL/PLATELET
Abs Immature Granulocytes: 0.01 10*3/uL (ref 0.00–0.07)
Basophils Absolute: 0 10*3/uL (ref 0.0–0.1)
Basophils Relative: 1 %
Eosinophils Absolute: 0 10*3/uL (ref 0.0–0.5)
Eosinophils Relative: 0 %
HCT: 40.8 % (ref 36.0–46.0)
Hemoglobin: 14.3 g/dL (ref 12.0–15.0)
Immature Granulocytes: 0 %
Lymphocytes Relative: 11 %
Lymphs Abs: 0.7 10*3/uL (ref 0.7–4.0)
MCH: 34.5 pg — ABNORMAL HIGH (ref 26.0–34.0)
MCHC: 35 g/dL (ref 30.0–36.0)
MCV: 98.3 fL (ref 80.0–100.0)
Monocytes Absolute: 0.4 10*3/uL (ref 0.1–1.0)
Monocytes Relative: 6 %
Neutro Abs: 4.9 10*3/uL (ref 1.7–7.7)
Neutrophils Relative %: 82 %
Platelets: 199 10*3/uL (ref 150–400)
RBC: 4.15 MIL/uL (ref 3.87–5.11)
RDW: 12.4 % (ref 11.5–15.5)
WBC: 6 10*3/uL (ref 4.0–10.5)
nRBC: 0 % (ref 0.0–0.2)

## 2022-01-12 LAB — COMPREHENSIVE METABOLIC PANEL
ALT: 27 U/L (ref 0–44)
AST: 28 U/L (ref 15–41)
Albumin: 4.2 g/dL (ref 3.5–5.0)
Alkaline Phosphatase: 65 U/L (ref 38–126)
Anion gap: 7 (ref 5–15)
BUN: 11 mg/dL (ref 8–23)
CO2: 26 mmol/L (ref 22–32)
Calcium: 8.8 mg/dL — ABNORMAL LOW (ref 8.9–10.3)
Chloride: 103 mmol/L (ref 98–111)
Creatinine, Ser: 0.73 mg/dL (ref 0.44–1.00)
GFR, Estimated: >60 mL/min (ref >60)
Glucose, Bld: 101 mg/dL — ABNORMAL HIGH (ref 70–99)
Potassium: 4 mmol/L (ref 3.5–5.1)
Sodium: 136 mmol/L (ref 135–145)
Total Bilirubin: 0.9 mg/dL (ref 0.3–1.2)
Total Protein: 8.1 g/dL (ref 6.5–8.1)

## 2022-01-12 LAB — RESP PANEL BY RT-PCR (FLU A&B, COVID) ARPGX2
Influenza A by PCR: NEGATIVE
Influenza B by PCR: NEGATIVE
SARS Coronavirus 2 by RT PCR: POSITIVE — AB

## 2022-01-12 LAB — URINALYSIS, ROUTINE W REFLEX MICROSCOPIC
Bacteria, UA: NONE SEEN
Bilirubin Urine: NEGATIVE
Glucose, UA: NEGATIVE mg/dL
Hgb urine dipstick: NEGATIVE
Ketones, ur: NEGATIVE mg/dL
Nitrite: NEGATIVE
Protein, ur: NEGATIVE mg/dL
Specific Gravity, Urine: 1.015 (ref 1.005–1.030)
pH: 5 (ref 5.0–8.0)

## 2022-01-12 LAB — LACTIC ACID, PLASMA
Lactic Acid, Venous: 0.8 mmol/L (ref 0.5–1.9)
Lactic Acid, Venous: 0.6 mmol/L (ref 0.5–1.9)

## 2022-01-12 LAB — GROUP A STREP BY PCR: Group A Strep by PCR: NOT DETECTED

## 2022-01-12 MED ORDER — ACETAMINOPHEN 325 MG PO TABS
650.0000 mg | ORAL_TABLET | Freq: Once | ORAL | Status: AC
  Administered 2022-01-12: 650 mg via ORAL
  Filled 2022-01-12: qty 2

## 2022-01-12 MED ORDER — NIRMATRELVIR/RITONAVIR (PAXLOVID)TABLET: 3.0000 | ORAL_TABLET | Freq: Two times a day (BID) | ORAL | 0 refills | Status: AC

## 2022-01-12 MED ORDER — KETOROLAC TROMETHAMINE 15 MG/ML IJ SOLN
10.0000 mg | Freq: Once | INTRAMUSCULAR | Status: AC
Start: 2022-01-12 — End: 2022-01-12
  Administered 2022-01-12: 10 mg via INTRAVENOUS
  Filled 2022-01-12: qty 1

## 2022-01-12 NOTE — Discharge Instructions (Signed)
Stop your trazodone while you are taking the medicine.  Also decrease your diltiazem in half.

## 2022-01-12 NOTE — ED Triage Notes (Signed)
Pt to ER with c/o headache, fever, sore throat and chest congestion.  Pt states she did not feel well last night, but woke with severe headache and fever this AM.  Pt has not taken any medications this AM.

## 2022-01-12 NOTE — ED Provider Notes (Signed)
[facility] EMERGENCY DEPARTMENT Provider Note   CSN: [id number] Arrival date & time: 01/12/22  5670     History  Chief Complaint  Patient presents with   Headache   Fever   Sore Throat    Courtney Pratt is a 66 y.o. female.   Headache Associated symptoms: fever   Fever Associated symptoms: headaches   Sore Throat Associated symptoms include headaches.  Patient presents with sore throat cough congestion.  Slight production.  Felt a little off last night but worse this morning.  Now worsening headache.  Has a history of ulcerative colitis and is on Humira and methotrexate.  Reported fevers up to 104 at home.  States she is aching all over.  No definite sick contacts but was recently on a trip to [place].     Home Medications Prior to Admission medications   Medication Sig Start Date End Date Taking? Authorizing Provider  nirmatrelvir/ritonavir EUA (PAXLOVID) 20 x 150 MG & 10 x '100MG'$  TABS Take 3 tablets by mouth 2 (two) times daily for 5 days. Patient GFR is >60. Take nirmatrelvir (150 mg) two tablets twice daily for 5 days and ritonavir (100 mg) one tablet twice daily for 5 days. 01/12/22 01/17/22 Yes Davonna Belling, MD      Allergies    Amoxicillin-pot clavulanate, Morphine, and Tramadol hcl    Review of Systems   Review of Systems  Constitutional:  Positive for fever.  Neurological:  Positive for headaches.    Physical Exam Updated Vital Signs BP (!) 160/70   Pulse 90   Temp (!) 100.4 F (38 C) (Oral)   Resp 19   Ht '5\' 10"'$  (1.778 m)   Wt 83.9 kg   LMP 01/06/2020   SpO2 94%   BMI 26.54 kg/m  Physical Exam Vitals and nursing note reviewed.  HENT:     Head: Atraumatic.  Cardiovascular:     Rate and Rhythm: Regular rhythm.  Pulmonary:     Breath sounds: No wheezing or rhonchi.  Chest:     Chest wall: No tenderness.  Musculoskeletal:     Cervical back: No rigidity.  Skin:    General: Skin is warm.  Neurological:     Mental Status: She is alert and  oriented to person, place, and time.     ED Results / Procedures / Treatments   Labs (all labs ordered are listed, but only abnormal results are displayed) Labs Reviewed  RESP PANEL BY RT-PCR (FLU A&B, COVID) ARPGX2 - Abnormal; Notable for the following components:      Result Value   SARS Coronavirus 2 by RT PCR POSITIVE (*)    All other components within normal limits  COMPREHENSIVE METABOLIC PANEL - Abnormal; Notable for the following components:   Glucose, Bld 101 (*)    Calcium 8.8 (*)    All other components within normal limits  CBC WITH DIFFERENTIAL/PLATELET - Abnormal; Notable for the following components:   MCH 34.5 (*)    All other components within normal limits  URINALYSIS, ROUTINE W REFLEX MICROSCOPIC - Abnormal; Notable for the following components:   Leukocytes,Ua SMALL (*)    All other components within normal limits  CULTURE, BLOOD (ROUTINE X 2)  CULTURE, BLOOD (ROUTINE X 2)  GROUP A STREP BY PCR  LACTIC ACID, PLASMA  LACTIC ACID, PLASMA    EKG None  Radiology DG Chest Portable 1 View  Result Date: 01/12/2022 CLINICAL DATA:  Cough EXAM: PORTABLE CHEST 1 VIEW COMPARISON:  None Available.  FINDINGS: The heart size and mediastinal contours are within normal limits. Left upper lobe calcified granulomas. Mild bibasilar linear opacities, likely due to scarring or atelectasis. Lungs are otherwise clear. The visualized skeletal structures are unremarkable. IMPRESSION: No active disease. Electronically Signed   By: Yetta Glassman M.D.   On: 01/12/2022 10:31    Procedures Procedures    Medications Ordered in ED Medications  ketorolac (TORADOL) 15 MG/ML injection 10 mg (10 mg Intravenous Given 01/12/22 1106)  acetaminophen (TYLENOL) tablet 650 mg (650 mg Oral Given 01/12/22 1205)    ED Course/ Medical Decision Making/ A&P                           Medical Decision Making Problems Addressed: COVID: acute illness or injury  Amount and/or Complexity of Data  Reviewed Labs: ordered. Radiology: ordered.  Risk OTC drugs. Prescription drug management.   Patient with fever cough and sore throat.  Higher risk due to immunosuppression.  Chest x-ray independently interpreted showed no pneumonia.  Does not appear septic.  Normal lactic acid.  However does have a positive COVID test.  Discussed with patient and will treat with Paxlovid.  Will decrease her trazodone and will decrease diltiazem while on medicine.  Appears stable for discharge home does not appear to require admission to the hospital.  Will return for worsening symptoms.        Final Clinical Impression(s) / ED Diagnoses Final diagnoses:  COVID    Rx / DC Orders ED Discharge Orders          Ordered    nirmatrelvir/ritonavir EUA (PAXLOVID) 20 x 150 MG & 10 x '100MG'$  TABS  2 times daily        01/12/22 1416              Davonna Belling, MD 01/12/22 1556

## 2022-01-17 LAB — CULTURE, BLOOD (ROUTINE X 2)
Culture: NO GROWTH
Culture: NO GROWTH
Special Requests: ADEQUATE
Special Requests: ADEQUATE

## 2023-12-07 IMAGING — DX DG CHEST 1V PORT
1 series · 1 of 1 positions shown · non-contrast
Comparison: None Available.

CLINICAL DATA: Cough

EXAM:
PORTABLE CHEST 1 VIEW

[chest ap]
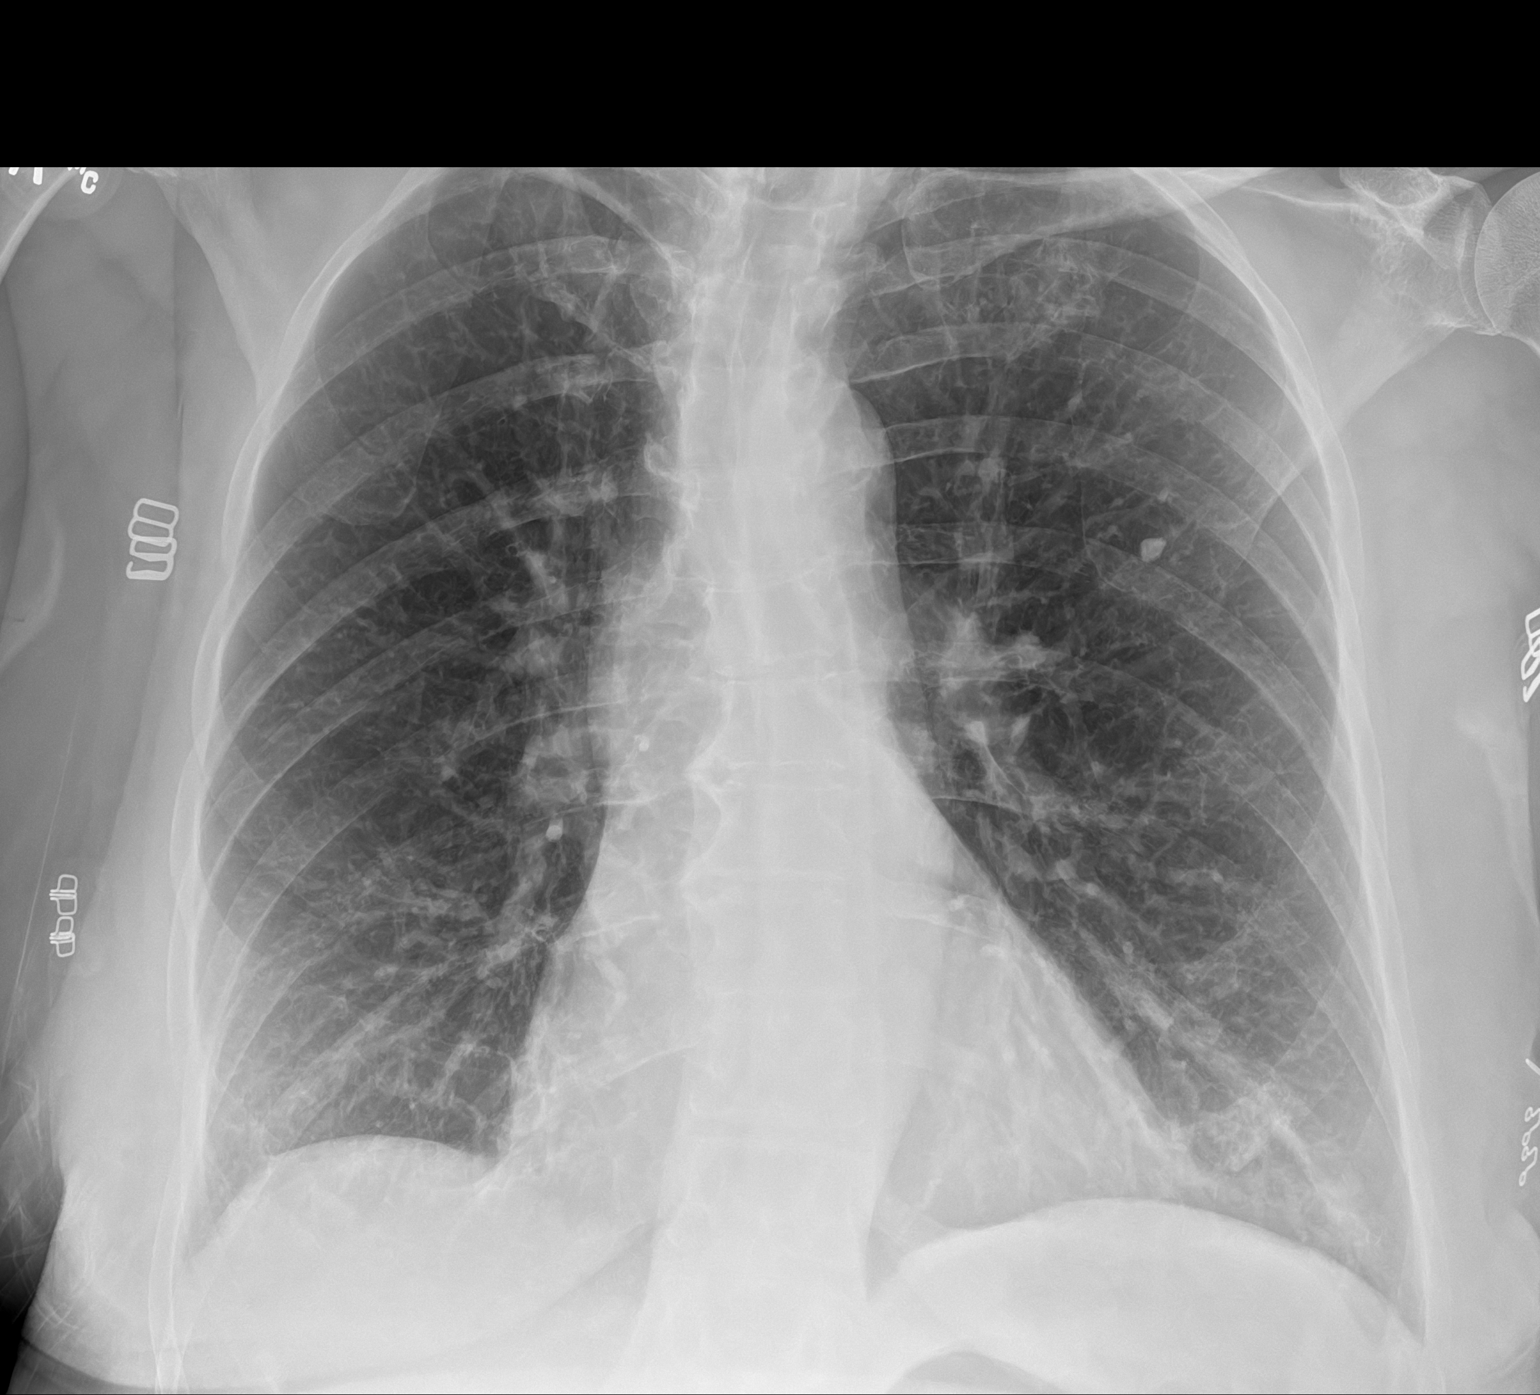

[1 of 1 positions shown; findings below may reference images not displayed]

FINDINGS: The heart size and mediastinal contours are within normal limits.
Left upper lobe calcified granulomas. Mild bibasilar linear
opacities, likely due to scarring or atelectasis. Lungs are
otherwise clear. The visualized skeletal structures are
unremarkable.
IMPRESSION: No active disease.
